# Patient Record
Sex: Male | Born: 1970 | Race: White | Hispanic: No
Health system: Western US, Community
[De-identification: ages and names within clinical notes are randomized; demographics above are authoritative.]

## PROBLEM LIST (undated history)

## (undated) DIAGNOSIS — F4310 Post-traumatic stress disorder, unspecified: Secondary | ICD-10-CM

## (undated) DIAGNOSIS — F419 Anxiety disorder, unspecified: Secondary | ICD-10-CM

## (undated) DIAGNOSIS — F329 Major depressive disorder, single episode, unspecified: Secondary | ICD-10-CM

## (undated) DIAGNOSIS — F319 Bipolar disorder, unspecified: Secondary | ICD-10-CM

## (undated) DIAGNOSIS — F32A Depression, unspecified: Secondary | ICD-10-CM

---

## 2001-09-30 ENCOUNTER — Encounter: Payer: Self-pay | Admitting: Family Medicine

## 2001-09-30 ENCOUNTER — Encounter: Admission: RE | Admit: 2001-09-30 | Discharge: 2001-09-30 | Payer: Self-pay | Admitting: Family Medicine

## 2003-11-27 ENCOUNTER — Inpatient Hospital Stay (HOSPITAL_COMMUNITY): Admission: EM | Admit: 2003-11-27 | Discharge: 2003-11-28 | Payer: Self-pay | Admitting: *Deleted

## 2003-12-13 ENCOUNTER — Inpatient Hospital Stay (HOSPITAL_COMMUNITY): Admission: EM | Admit: 2003-12-13 | Discharge: 2003-12-13 | Payer: Self-pay | Admitting: Emergency Medicine

## 2005-05-11 ENCOUNTER — Ambulatory Visit: Payer: Self-pay | Admitting: Family Medicine

## 2006-02-22 IMAGING — CR DG CHEST 2V
2 series · 2 of 2 positions shown · non-contrast
Comparison: none

CLINICAL DATA: 33-year-old with altered level of consciousness.  
 CHEST (TWO VIEWS)
 No prior exams are available for comparison.  Cardiomediastinal silhouette is within normal limits.  Lungs are free of focal consolidations and pleural effusions.  Left costophrenic angle is excluded on the frontal view. 
 IMPRESSION
 No evidence for acute cardiopulmonary disease.

[view not recorded (1 of 2)]
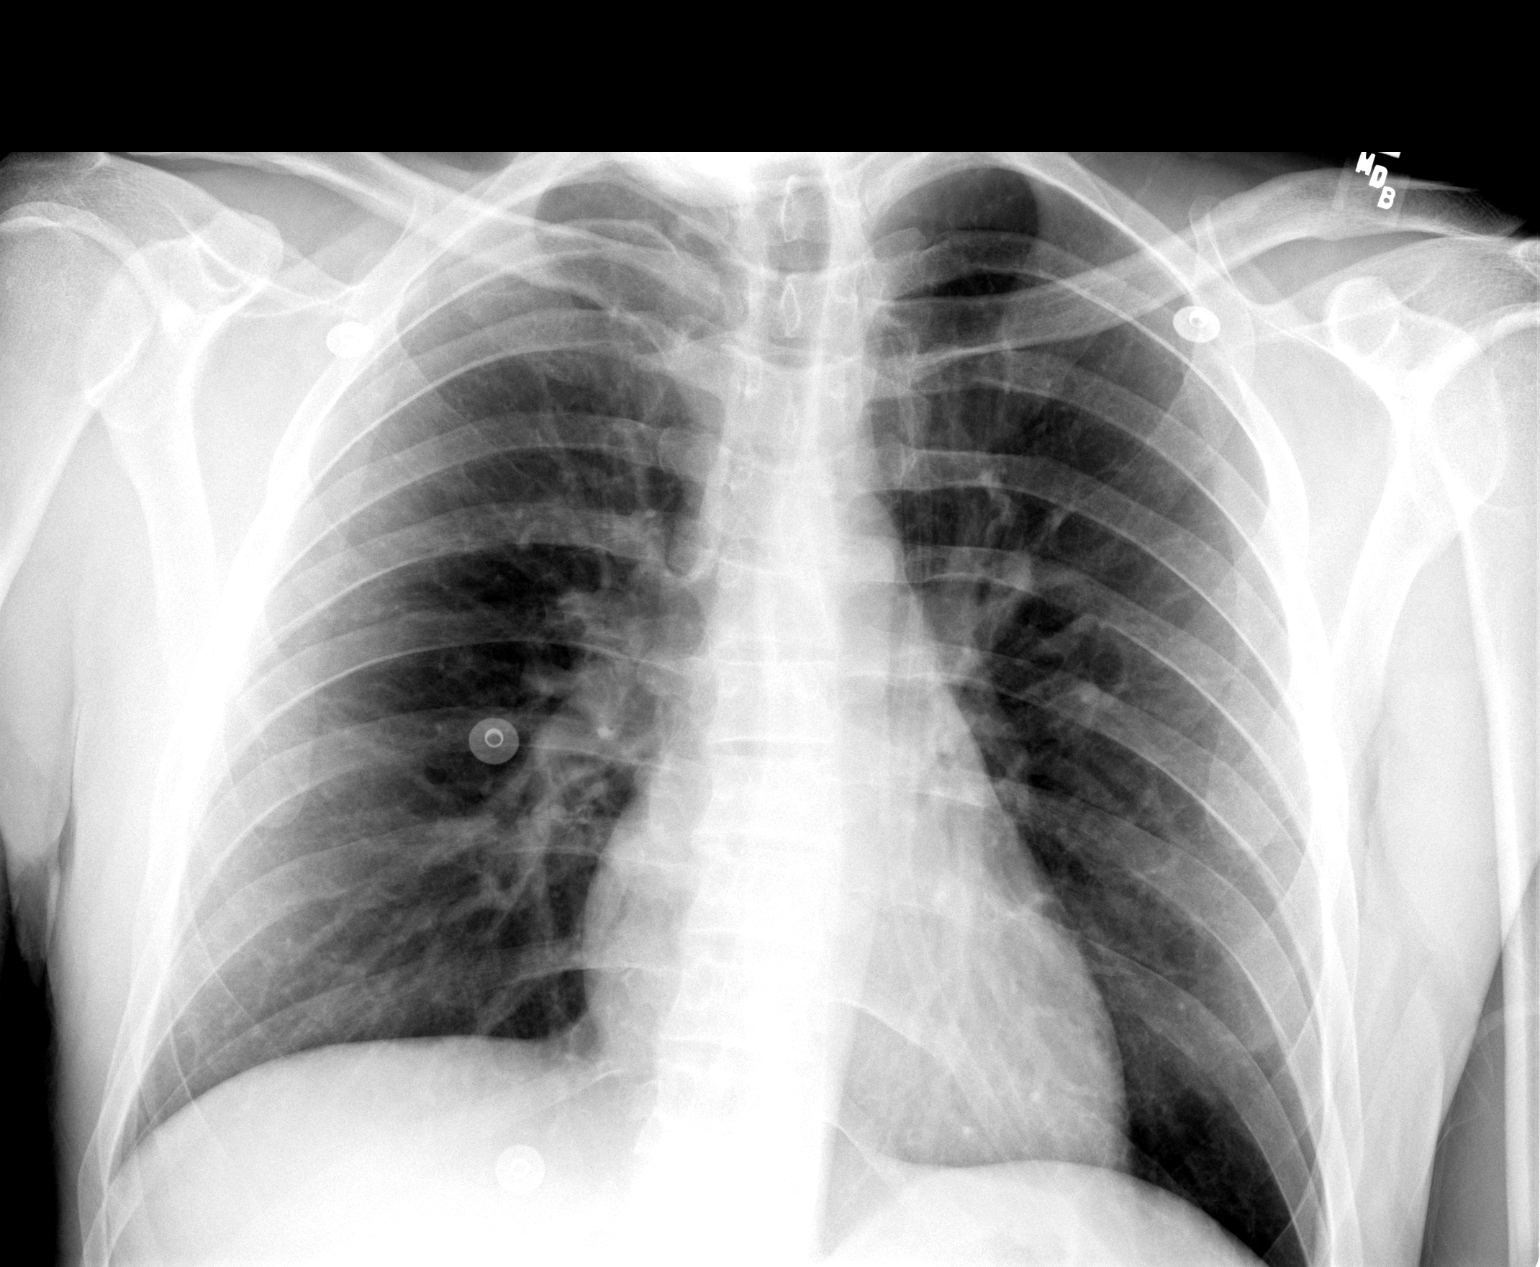

[view not recorded (2 of 2)]
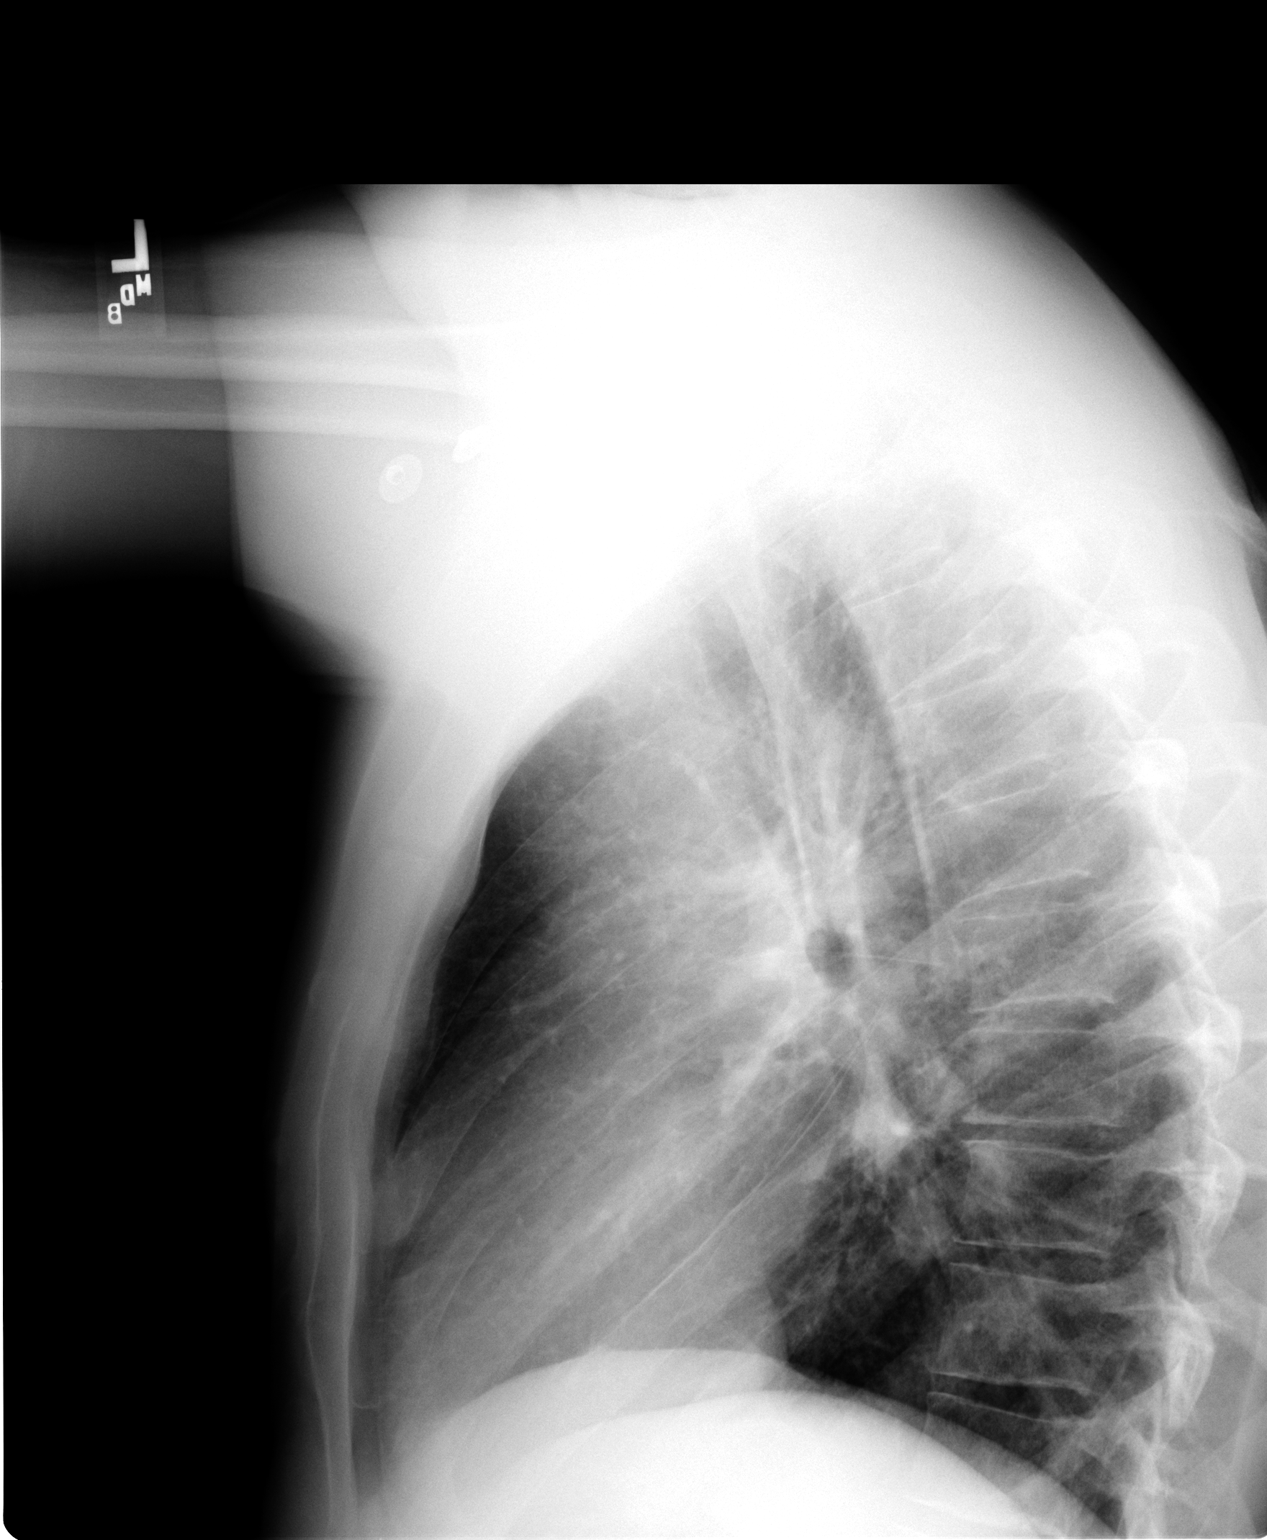

[2 of 2 positions shown; findings below may reference images not displayed]

## 2013-12-23 ENCOUNTER — Encounter (HOSPITAL_COMMUNITY): Payer: Self-pay | Admitting: Emergency Medicine

## 2013-12-23 ENCOUNTER — Emergency Department (HOSPITAL_COMMUNITY)
Admission: EM | Admit: 2013-12-23 | Discharge: 2013-12-23 | Discharge status: Against Medical Advice | Payer: Managed Care, Other (non HMO) | Attending: Emergency Medicine | Admitting: Emergency Medicine

## 2013-12-23 DIAGNOSIS — F329 Major depressive disorder, single episode, unspecified: Secondary | ICD-10-CM | POA: Diagnosis present

## 2013-12-23 DIAGNOSIS — F3289 Other specified depressive episodes: Secondary | ICD-10-CM | POA: Insufficient documentation

## 2013-12-23 DIAGNOSIS — F32A Depression, unspecified: Secondary | ICD-10-CM

## 2013-12-23 DIAGNOSIS — IMO0002 Reserved for concepts with insufficient information to code with codable children: Secondary | ICD-10-CM | POA: Diagnosis not present

## 2013-12-23 DIAGNOSIS — F17200 Nicotine dependence, unspecified, uncomplicated: Secondary | ICD-10-CM | POA: Insufficient documentation

## 2013-12-23 HISTORY — DX: Major depressive disorder, single episode, unspecified: F32.9

## 2013-12-23 HISTORY — DX: Bipolar disorder, unspecified: F31.9

## 2013-12-23 HISTORY — DX: Anxiety disorder, unspecified: F41.9

## 2013-12-23 HISTORY — DX: Post-traumatic stress disorder, unspecified: F43.10

## 2013-12-23 HISTORY — DX: Depression, unspecified: F32.A

## 2013-12-23 LAB — COMPREHENSIVE METABOLIC PANEL
ALT: 14 U/L (ref 0–53)
AST: 19 U/L (ref 0–37)
Albumin: 4.4 g/dL (ref 3.5–5.2)
Alkaline Phosphatase: 60 U/L (ref 39–117)
Anion gap: 12 (ref 5–15)
BUN: 19 mg/dL (ref 6–23)
CO2: 26 mEq/L (ref 19–32)
Calcium: 9.6 mg/dL (ref 8.4–10.5)
Chloride: 101 mEq/L (ref 96–112)
Creatinine, Ser: 1.09 mg/dL (ref 0.50–1.35)
GFR calc Af Amer: >90 mL/min (ref >90)
GFR calc non Af Amer: 82 mL/min — ABNORMAL LOW (ref >90)
Glucose, Bld: 96 mg/dL (ref 70–99)
Potassium: 4.4 mEq/L (ref 3.7–5.3)
Sodium: 139 mEq/L (ref 137–147)
Total Bilirubin: 0.2 mg/dL — ABNORMAL LOW (ref 0.3–1.2)
Total Protein: 7.7 g/dL (ref 6.0–8.3)

## 2013-12-23 LAB — CBC
HCT: 42.5 % (ref 39.0–52.0)
Hemoglobin: 15 g/dL (ref 13.0–17.0)
MCH: 34.3 pg — ABNORMAL HIGH (ref 26.0–34.0)
MCHC: 35.3 g/dL (ref 30.0–36.0)
MCV: 97.3 fL (ref 78.0–100.0)
Platelets: 289 10*3/uL (ref 150–400)
RBC: 4.37 MIL/uL (ref 4.22–5.81)
RDW: 12.7 % (ref 11.5–15.5)
WBC: 6.7 10*3/uL (ref 4.0–10.5)

## 2013-12-23 LAB — ETHANOL: Alcohol, Ethyl (B): <11 mg/dL (ref 0–11)

## 2013-12-23 NOTE — ED Notes (Signed)
Pt states that he has PTSD and anxiety.  Pt states that he has been having panic attacks.  Pt states that he has clean for about a month but yesterday went out and got drunk and did some coke. 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  Passively suicidal.  Denies HI.

## 2013-12-23 NOTE — ED Notes (Signed)
Came out of another pts room, pt not sitting on bed and not in restroom, was told by another staff member pt and wife left.

## 2013-12-23 NOTE — ED Provider Notes (Signed)
CSN: [id number]     Arrival date & time 12/23/13  1827 History   First MD Initiated Contact with Patient 12/23/13 2004     Chief Complaint  Patient presents with  . Anxiety   . Depression      (Consider location/radiation/quality/duration/timing/severity/associated sxs/prior Treatment) HPI Comments: Patient here complaining and depression without SI or HI. States he has had insomnia and anorexia. States that he did return last evening and use cocaine. Symptoms have been present for several days and getting worse. No visual or auditory hallucinations. Denies any command hallucinations. Symptoms persisted and no treatment used prior to arrival. Patient was on psychiatric meds for a long time but stopped them a couple weeks ago when he stopped seeing a psychiatrist. Nothing makes them better.  The history is provided by the patient and the spouse.    Past Medical History  Diagnosis Date  . Anxiety   . Depression   . PTSD (post-traumatic stress disorder)   . Bipolar 1 disorder    History reviewed. No pertinent past surgical history. History reviewed. No pertinent family history. History  Substance Use Topics  . Smoking status: Current Every Day Smoker  . Smokeless tobacco: Not on file  . Alcohol Use: Yes     Comment: binges    Review of Systems  All other systems reviewed and are negative.     Allergies  Codeine  Home Medications   Prior to Admission medications   Not on File   BP 109/80  Pulse 83  Temp(Src) 97.8 F (36.6 C) (Oral)  Resp 18  SpO2 100% Physical Exam  Nursing note and vitals reviewed. Constitutional: He is oriented to person, place, and time. He appears well-developed and well-nourished.  Non-toxic appearance. No distress.  HENT:  Head: Normocephalic and atraumatic.  Eyes: Conjunctivae, EOM and lids are normal. Pupils are equal, round, and reactive to light.  Neck: Normal range of motion. Neck supple. No tracheal deviation present. No mass  present.  Cardiovascular: Normal rate, regular rhythm and normal heart sounds.  Exam reveals no gallop.   No murmur heard. Pulmonary/Chest: Effort normal and breath sounds normal. No stridor. No respiratory distress. He has no decreased breath sounds. He has no wheezes. He has no rhonchi. He has no rales.  Abdominal: Soft. Normal appearance and bowel sounds are normal. He exhibits no distension. There is no tenderness. There is no rebound and no CVA tenderness.  Musculoskeletal: Normal range of motion. He exhibits no edema and no tenderness.  Neurological: He is alert and oriented to person, place, and time. He has normal strength. No cranial nerve deficit or sensory deficit. GCS eye subscore is 4. GCS verbal subscore is 5. GCS motor subscore is 6.  Skin: Skin is warm and dry. No abrasion and no rash noted.  Psychiatric: His speech is normal. His affect is labile. He is agitated. He expresses no suicidal plans and no homicidal plans.    ED Course  Procedures (including critical care time) Labs Review Labs Reviewed  CBC - Abnormal; Notable for the following:    MCH 34.3 (*)    All other components within normal limits  COMPREHENSIVE METABOLIC PANEL - Abnormal; Notable for the following:    Total Bilirubin 0.2 (*)    GFR calc non Af Amer 82 (*)    All other components within normal limits  ETHANOL  URINE RAPID DRUG SCREEN (HOSP PERFORMED)    Imaging Review No results found.   EKG Interpretation None  MDM   Final diagnoses:  None    Plan was to referred patient to behavior health for further assessment. Patient became upset and angry when I informed him that he was not able to leave and smoke cigarettes. I had a further discussion with the patient's wife and encouraged her to have him return for treatment.    Toy BakerAnthony T Amberlee Garvey, MD 12/23/13 2035

## 2013-12-23 NOTE — ED Notes (Signed)
Pt upset that he was placed in the hallway, states I'm tired of waiting, I'm going to smoke a cigarette, pt informed if he goes to smoke a cigarette he will be leaving and then he would have to restart this process, pt states "this is bullshit, I'm stuck in the hall and now I have to sit here and wait with no tv". Pt agitated.

## 2019-11-03 ENCOUNTER — Encounter (HOSPITAL_COMMUNITY): Payer: Self-pay

## 2019-11-03 ENCOUNTER — Observation Stay (HOSPITAL_COMMUNITY)
Admission: EM | Admit: 2019-11-03 | Discharge: 2019-11-03 | Disposition: A | Discharge status: Home / Self Care | Payer: Managed Care, Other (non HMO) | Attending: Internal Medicine | Admitting: Internal Medicine

## 2019-11-03 ENCOUNTER — Other Ambulatory Visit: Payer: Self-pay

## 2019-11-03 DIAGNOSIS — R5383 Other fatigue: Secondary | ICD-10-CM | POA: Diagnosis present

## 2019-11-03 DIAGNOSIS — F17200 Nicotine dependence, unspecified, uncomplicated: Secondary | ICD-10-CM | POA: Insufficient documentation

## 2019-11-03 DIAGNOSIS — Z20822 Contact with and (suspected) exposure to covid-19: Secondary | ICD-10-CM | POA: Insufficient documentation

## 2019-11-03 DIAGNOSIS — E8729 Other acidosis: Secondary | ICD-10-CM | POA: Diagnosis present

## 2019-11-03 DIAGNOSIS — F1490 Cocaine use, unspecified, uncomplicated: Secondary | ICD-10-CM | POA: Diagnosis present

## 2019-11-03 DIAGNOSIS — N179 Acute kidney failure, unspecified: Secondary | ICD-10-CM | POA: Diagnosis not present

## 2019-11-03 DIAGNOSIS — R739 Hyperglycemia, unspecified: Secondary | ICD-10-CM | POA: Diagnosis present

## 2019-11-03 DIAGNOSIS — R451 Restlessness and agitation: Secondary | ICD-10-CM | POA: Insufficient documentation

## 2019-11-03 DIAGNOSIS — E872 Acidosis: Secondary | ICD-10-CM

## 2019-11-03 LAB — CBC WITH DIFFERENTIAL/PLATELET
Abs Immature Granulocytes: 0.18 10*3/uL — ABNORMAL HIGH (ref 0.00–0.07)
Basophils Absolute: 0.1 10*3/uL (ref 0.0–0.1)
Basophils Relative: 1 %
Eosinophils Absolute: 0.1 10*3/uL (ref 0.0–0.5)
Eosinophils Relative: 1 %
HCT: 55.2 % — ABNORMAL HIGH (ref 39.0–52.0)
Hemoglobin: 17.6 g/dL — ABNORMAL HIGH (ref 13.0–17.0)
Immature Granulocytes: 2 %
Lymphocytes Relative: 33 %
Lymphs Abs: 4 10*3/uL (ref 0.7–4.0)
MCH: 33 pg (ref 26.0–34.0)
MCHC: 31.9 g/dL (ref 30.0–36.0)
MCV: 103.4 fL — ABNORMAL HIGH (ref 80.0–100.0)
Monocytes Absolute: 1 10*3/uL (ref 0.1–1.0)
Monocytes Relative: 8 %
Neutro Abs: 6.7 10*3/uL (ref 1.7–7.7)
Neutrophils Relative %: 55 %
Platelets: 323 10*3/uL (ref 150–400)
RBC: 5.34 MIL/uL (ref 4.22–5.81)
RDW: 13.9 % (ref 11.5–15.5)
WBC: 12 10*3/uL — ABNORMAL HIGH (ref 4.0–10.5)
nRBC: 0 % (ref 0.0–0.2)

## 2019-11-03 LAB — COMPREHENSIVE METABOLIC PANEL
ALT: 42 U/L (ref 0–44)
AST: 38 U/L (ref 15–41)
Albumin: 4.6 g/dL (ref 3.5–5.0)
Alkaline Phosphatase: 41 U/L (ref 38–126)
Anion gap: 32 — ABNORMAL HIGH (ref 5–15)
BUN: 30 mg/dL — ABNORMAL HIGH (ref 6–20)
CO2: 9 mmol/L — ABNORMAL LOW (ref 22–32)
Calcium: 10 mg/dL (ref 8.9–10.3)
Chloride: 100 mmol/L (ref 98–111)
Creatinine, Ser: 2.06 mg/dL — ABNORMAL HIGH (ref 0.61–1.24)
GFR calc Af Amer: 43 mL/min — ABNORMAL LOW (ref >60)
GFR calc non Af Amer: 37 mL/min — ABNORMAL LOW (ref >60)
Glucose, Bld: 260 mg/dL — ABNORMAL HIGH (ref 70–99)
Potassium: 5.3 mmol/L — ABNORMAL HIGH (ref 3.5–5.1)
Sodium: 141 mmol/L (ref 135–145)
Total Bilirubin: 0.7 mg/dL (ref 0.3–1.2)
Total Protein: 7.6 g/dL (ref 6.5–8.1)

## 2019-11-03 LAB — URINALYSIS, ROUTINE W REFLEX MICROSCOPIC
Bilirubin Urine: NEGATIVE
Glucose, UA: 50 mg/dL — AB
Hgb urine dipstick: NEGATIVE
Ketones, ur: 5 mg/dL — AB
Leukocytes,Ua: NEGATIVE
Nitrite: NEGATIVE
Protein, ur: >=300 mg/dL — AB
Specific Gravity, Urine: 1.022 (ref 1.005–1.030)
pH: 6 (ref 5.0–8.0)

## 2019-11-03 LAB — RAPID URINE DRUG SCREEN, HOSP PERFORMED
Amphetamines: NOT DETECTED
Barbiturates: NOT DETECTED
Benzodiazepines: POSITIVE — AB
Cocaine: POSITIVE — AB
Opiates: NOT DETECTED
Tetrahydrocannabinol: NOT DETECTED

## 2019-11-03 LAB — CK: Total CK: 528 U/L — ABNORMAL HIGH (ref 49–397)

## 2019-11-03 LAB — CBG MONITORING, ED: Glucose-Capillary: 273 mg/dL — ABNORMAL HIGH (ref 70–99)

## 2019-11-03 LAB — ETHANOL: Alcohol, Ethyl (B): <10 mg/dL (ref <10)

## 2019-11-03 LAB — SARS CORONAVIRUS 2 BY RT PCR (HOSPITAL ORDER, PERFORMED IN ~~LOC~~ HOSPITAL LAB): SARS Coronavirus 2: NEGATIVE

## 2019-11-03 MED ORDER — HEPARIN SODIUM (PORCINE) 5000 UNIT/ML IJ SOLN: 5000.0000 [IU] | Freq: Three times a day (TID) | INTRAMUSCULAR | Status: DC

## 2019-11-03 MED ORDER — SODIUM CHLORIDE 0.9 % IV SOLN: INTRAVENOUS | Status: DC

## 2019-11-03 MED ORDER — ACETAMINOPHEN 650 MG RE SUPP: 650.0000 mg | Freq: Four times a day (QID) | RECTAL | Status: DC | PRN

## 2019-11-03 MED ORDER — NICOTINE 14 MG/24HR TD PT24: 14.0000 mg | MEDICATED_PATCH | Freq: Every day | TRANSDERMAL | Status: DC

## 2019-11-03 MED ORDER — ACETAMINOPHEN 325 MG PO TABS: 650.0000 mg | ORAL_TABLET | Freq: Four times a day (QID) | ORAL | Status: DC | PRN

## 2019-11-03 MED ORDER — ONDANSETRON HCL 4 MG/2ML IJ SOLN: 4.0000 mg | Freq: Four times a day (QID) | INTRAMUSCULAR | Status: DC | PRN

## 2019-11-03 MED ORDER — ONDANSETRON HCL 4 MG PO TABS: 4.0000 mg | ORAL_TABLET | Freq: Four times a day (QID) | ORAL | Status: DC | PRN

## 2019-11-03 MED ORDER — SODIUM CHLORIDE 0.9 % IV BOLUS
2000.0000 mL | Freq: Once | INTRAVENOUS | Status: AC
  Administered 2019-11-03: 2000 mL via INTRAVENOUS

## 2019-11-03 NOTE — H&P (Signed)
[facility] History and Physical  Joe Porter DOB: April 15, 1970 DOA: 11/03/2019   PCP: Patient, No Pcp Per  Specialists: None  Chief Complaint: Fatigue, agitation  HPI: Joe Porter is a 49 y.o. male with past medical history of bipolar disorder according to the EDP notes who is here from [place].  Apparently he is in the process of separating from his wife and has been under a lot of stress.  He smoked some cocaine earlier this morning which was followed by fatigue.  Apparently had some tactile hallucinations.  And then he got agitated.  Unclear as to who called EMS.  He was noted to be aggressive.  Had to be placed in restraints and had to be given Versed.  Noted to be tachycardic and hypotensive.  Was brought into the emergency department.  He was also noted to have abrasions on his feet.  Patient mentions that he fell down due to profound weakness.  Feels better now.  Denies any chest pain shortness of breath nausea vomiting or diarrhea.  Denies any history of diabetes but states that he takes "supplements" which causes glucose level to be high.  He used to take steroids till about few weeks ago when he was working out.  States he is up-to-date on his tetanus vaccination.  Received a booster last year.  In the emergency department patient was found to have high anion gap metabolic acidosis and acute renal failure.  He was noted to be hyperglycemic.  Initially he was tachycardic.  Heart rate has improved with hydration.  Due to other electrolyte abnormalities and metabolic derangements he will need hospitalization.  Home Medications: Prior to Admission medications   Not on File    Allergies:  Allergies  Allergen Reactions  . Codeine     jaundice    Past Medical History: Past Medical History:  Diagnosis Date  . Anxiety   . Bipolar 1 disorder (HCC)   . Depression   . PTSD (post-traumatic stress disorder)     History reviewed. No pertinent surgical  history.  Social History: He usually lives in [place].  Smokes half a pack of cigarettes on a daily basis.  Denies any alcohol use.  Denies any IV drug use.  Does not use cocaine on a regular basis.  Used it just once this morning.  Has been drug-free for past 5 years according to him.  Family History:   Denies any health problems in his family.  Review of Systems - History obtained from the patient General ROS: positive for  - fatigue Psychological ROS: positive for - anxiety Ophthalmic ROS: negative ENT ROS: negative Allergy and Immunology ROS: negative Hematological and Lymphatic ROS: negative Endocrine ROS: negative Respiratory ROS: no cough, shortness of breath, or wheezing Cardiovascular ROS: no chest pain or dyspnea on exertion Gastrointestinal ROS: no abdominal pain, change in bowel habits, or black or bloody stools Genito-Urinary ROS: no dysuria, trouble voiding, or hematuria Musculoskeletal ROS: negative Neurological ROS: no TIA or stroke symptoms Dermatological ROS: negative  Physical Examination  Vitals:   11/03/19 1009 11/03/19 1010 11/03/19 1015 11/03/19 1034  BP: (!) 95/47  (!) 95/47 (!) 91/54  Pulse: 99  (!) 108 (!) 120  Resp: (!) 40  (!) 30 (!) 26  Temp:  99.3 F (37.4 C)    TempSrc: Axillary Axillary    SpO2: 95%  96% 98%  Weight:  78.9 kg    Height:  6\' 1"  (1.854 m)      BP )  91/54   Pulse (!) 120   Temp 99.3 F (37.4 C) (Axillary)   Resp (!) 26   Ht 6\' 1"  (1.854 m)   Wt 78.9 kg   SpO2 98%   BMI 22.96 kg/m   General appearance: alert, cooperative, appears stated age and no distress Head: Normocephalic, without obvious abnormality, atraumatic Eyes: conjunctivae/corneas clear. PERRL, EOM's intact.  Throat: lips, mucosa, and tongue normal; teeth and gums normal Neck: no adenopathy, no carotid bruit, no JVD, supple, symmetrical, trachea midline and thyroid not enlarged, symmetric, no tenderness/mass/nodules Resp: clear to auscultation  bilaterally Cardio: regular rate and rhythm, S1, S2 normal, no murmur, click, rub or gallop GI: soft, non-tender; bowel sounds normal; no masses,  no organomegaly Extremities: extremities normal, atraumatic, no cyanosis or edema Pulses: 2+ and symmetric Skin: Abrasions and skin tears noted in the feet bilaterally Lymph nodes: Cervical, supraclavicular, and axillary nodes normal. Neurologic: Alert and oriented x3.  Cranial nerves II to XII intact.  Motor strength equal bilateral upper and lower extremity.  Able to lift both legs off the bed.   Labs on Admission: I have personally reviewed following labs and imaging studies  CBC: Recent Labs  Lab 11/03/19 1018  WBC 12.0*  NEUTROABS 6.7  HGB 17.6*  HCT 55.2*  MCV 103.4*  PLT 323   Basic Metabolic Panel: Recent Labs  Lab 11/03/19 1018  NA 141  K 5.3*  CL 100  CO2 9*  GLUCOSE 260*  BUN 30*  CREATININE 2.06*  CALCIUM 10.0   GFR: Estimated Creatinine Clearance: 48.9 mL/min (A) (by C-G formula based on SCr of 2.06 mg/dL (H)). Liver Function Tests: Recent Labs  Lab 11/03/19 1018  AST 38  ALT 42  ALKPHOS 41  BILITOT 0.7  PROT 7.6  ALBUMIN 4.6   Cardiac Enzymes: Recent Labs  Lab 11/03/19 1018  CKTOTAL 528*   CBG: Recent Labs  Lab 11/03/19 1017  GLUCAP 273*      Problem List  Principal Problem:   ARF (acute renal failure) (HCC) Active Problems:   Cocaine use   High anion gap metabolic acidosis   Hyperglycemia   AKI (acute kidney injury) (HCC)   Assessment: This is a 49 year old Caucasian male who used cocaine this morning and developed agitation and aggressive behavior.  He was given Versed and appears to have calmed down.  His blood work reveals acute renal failure and hyperglycemia with elevated anion gap metabolic acidosis.  Plan:  1. Acute kidney injury with metabolic acidosis/mild hyperkalemia: Likely due to cocaine use.  Patient has been making urine.  We will check a UA.  Aggressively hydrate  him.  Recheck his labs.  Potassium level will be rechecked.  His CK level is slightly elevated possibly due to cocaine use.  Check again tomorrow.  Aggressively hydrate.  2.  High anion gap metabolic acidosis with hyperglycemia: Patient states that he takes a lot of "supplements" which causes glucose level to be elevated.  Used to take steroids as well.  Denies any history of diabetes but has had high glucose levels previously.  He is noted to have elevated anion gap.  Unclear if this is DKA or if his acidosis is related to renal insufficiency or combination of both.  We will check a beta hydroxybutyric acid level, lactic acid, check UA, repeat his labs.  Check HbA1c.  May need to use insulin labs are persistently abnormal.  3. Acute delirium secondary to cocaine: Seems to be back to baseline now.  No focal  deficits.  4.  Macrocytosis: Check TSH B12 folate levels.  5. Cocaine use: Patient mentions that he has been drug-free for 5 years. He has been under a lot of stress recently.  He smoked cocaine earlier today which was his first use in a very long time.  Counseled.  6. Leukocytosis: Likely reactive.  He is afebrile.  No evidence for infection.  Follow-up on UA.  7.  Tobacco abuse: Nicotine patch.   DVT Prophylaxis: Subcutaneous heparin Code Status: Full code Family Communication: Discussed with the patient Disposition: Hopefully return home in improved Consults called: None Admission Status: Status is: Observation  The patient remains OBS appropriate and will d/c before 2 midnights.  Dispo: The patient is from: Home              Anticipated d/c is to: Home              Anticipated d/c date is: 1 day              Patient currently is not medically stable to d/c.   Severity of Illness: The appropriate patient status for this patient is OBSERVATION. Observation status is judged to be reasonable and necessary in order to provide the required intensity of service to ensure the patient's  safety. The patient's presenting symptoms, physical exam findings, and initial radiographic and laboratory data in the context of their medical condition is felt to place them at decreased risk for further clinical deterioration. Furthermore, it is anticipated that the patient will be medically stable for discharge from the hospital within 2 midnights of admission. The following factors support the patient status of observation.   " The patient's presenting symptoms include agitation. " The physical exam findings include tachycardia. " The initial radiographic and laboratory data are significant for acute renal failure.   Further management decisions will depend on results of further testing and patient's response to treatment.   Shellyann Wandrey [employer]  [facility] [employer] on [email or web address]  11/03/2019, 1:12 PM

## 2019-11-03 NOTE — ED Notes (Signed)
Pt reported that he was not willing to stay and will follow up another day. MD notified Pt leaving AMA. Pt removed IV.

## 2019-11-03 NOTE — ED Triage Notes (Signed)
Pt BIBA in restraints placed by EMS. Pt states he did crack this morning, but it "feels different". Pt was banging head on floor. Pt has abrasions on back. Pt was aggressive with EMS. Pt was given 5 versed and restrained.  20 R AC 98/67 123 92% RA> 96% 2L CBG WNL

## 2019-11-03 NOTE — ED Notes (Signed)
Pt A&O x4 at this time. Reports he was having some issues with his wife who is out in [place]. 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

## 2019-11-03 NOTE — ED Provider Notes (Signed)
[name]-EMERGENCY DEPT Provider Note   CSN: [id number] Arrival date & time: 11/03/19  1004     History Chief Complaint  Patient presents with   Drug Overdose    Joe Porter is a 49 y.o. male.  49 year old male presents with increased agitation after ingesting what he thought was cocaine.  Developed tactile hallucinations.  Denies any alcohol use.  Patient banged his head on the floor but did not lose consciousness.  Started scratching at his body.  Also takes Lexapro for his bipolar disorder.  EMS called and patient given 5 mg of Versed IV and restrain for his protection.  Patient's blood sugar was within normal limits per EMS.        Past Medical History:  Diagnosis Date   Anxiety    Bipolar 1 disorder (HCC)    Depression    PTSD (post-traumatic stress disorder)     There are no problems to display for this patient.   History reviewed. No pertinent surgical history.     No family history on file.  Social History   Tobacco Use   Smoking status: Current Every Day Smoker  Substance Use Topics   Alcohol use: Yes    Comment: binges   Drug use: Yes    Types: Cocaine    Comment: binges    Home Medications Prior to Admission medications   Not on File    Allergies    Codeine  Review of Systems   Review of Systems  Unable to perform ROS: Psychiatric disorder    Physical Exam Updated Vital Signs BP (!) 95/47    Pulse 99    Temp 99.3 F (37.4 C) (Axillary)    Resp (!) 40    Ht 1.854 m (6\' 1" )    Wt 78.9 kg    SpO2 95%    BMI 22.96 kg/m   Physical Exam Vitals and nursing note reviewed.  Constitutional:      General: He is not in acute distress.    Appearance: Normal appearance. He is well-developed. He is not toxic-appearing.  HENT:     Head: Normocephalic and atraumatic.  Eyes:     General: Lids are normal.     Conjunctiva/sclera: Conjunctivae normal.     Pupils: Pupils are equal, round, and reactive to light.    Neck:     Thyroid: No thyroid mass.     Trachea: No tracheal deviation.  Cardiovascular:     Rate and Rhythm: Normal rate and regular rhythm.     Heart sounds: Normal heart sounds. No murmur heard.  No gallop.   Pulmonary:     Effort: Pulmonary effort is normal. No respiratory distress.     Breath sounds: Normal breath sounds. No stridor. No decreased breath sounds, wheezing, rhonchi or rales.  Abdominal:     General: Bowel sounds are normal. There is no distension.     Palpations: Abdomen is soft.     Tenderness: There is no abdominal tenderness. There is no rebound.  Musculoskeletal:        General: No tenderness. Normal range of motion.     Cervical back: Normal range of motion and neck supple.  Skin:    General: Skin is warm and dry.     Findings: No abrasion or rash.  Neurological:     General: No focal deficit present.     Mental Status: He is alert and oriented to person, place, and time.     GCS: GCS  eye subscore is 4. GCS verbal subscore is 5. GCS motor subscore is 6.     Cranial Nerves: No cranial nerve deficit.     Sensory: No sensory deficit.  Psychiatric:        Attention and Perception: He is inattentive.        Mood and Affect: Affect is labile.        Speech: Speech is slurred.        Behavior: Behavior is aggressive.     ED Results / Procedures / Treatments   Labs (all labs ordered are listed, but only abnormal results are displayed) Labs Reviewed - No data to display  EKG None  Radiology No results found.  Procedures Procedures (including critical care time)  Medications Ordered in ED Medications  sodium chloride 0.9 % bolus 2,000 mL (has no administration in time range)  0.9 %  sodium chloride infusion (has no administration in time range)    ED Course  I have reviewed the triage vital signs and the nursing notes.  Pertinent labs & imaging results that were available during my care of the patient were reviewed by me and considered in my  medical decision making (see chart for details).    MDM Rules/Calculators/A&P                          Patient given IV fluids here.  Has evidence of acute kidney injury.  Also tachycardic likely from this and mildly hypotensive.  Could be from other medications.  Will admit to the hospitalist service Final Clinical Impression(s) / ED Diagnoses Final diagnoses:  None    Rx / DC Orders ED Discharge Orders    None       Lorre Nick, MD 11/03/19 1254

## 2019-11-03 NOTE — Discharge Summary (Signed)
Please review H&P dictated at 1 PM earlier today.  Was informed by the nursing staff a few minutes ago the patient did not want to stay.  He was told that he could experience a worsening in his condition which could potentially lead to death.  He was told about his kidney failure.  He was told about the metabolic acidosis in layman terms.  He was told that he will need further work-up and possibly further treatments.  However patient was adamant about leaving.  He left AGAINST MEDICAL ADVICE.

## 2021-05-21 ENCOUNTER — Emergency Department (HOSPITAL_COMMUNITY)
Admission: EM | Admit: 2021-05-21 | Discharge: 2021-05-21 | Disposition: A | Discharge status: Home / Self Care | Payer: Self-pay | Attending: Emergency Medicine | Admitting: Emergency Medicine

## 2021-05-21 ENCOUNTER — Encounter (HOSPITAL_COMMUNITY): Payer: Self-pay | Admitting: Emergency Medicine

## 2021-05-21 ENCOUNTER — Emergency Department (HOSPITAL_COMMUNITY): Payer: Self-pay

## 2021-05-21 DIAGNOSIS — F439 Reaction to severe stress, unspecified: Secondary | ICD-10-CM | POA: Insufficient documentation

## 2021-05-21 DIAGNOSIS — Z720 Tobacco use: Secondary | ICD-10-CM | POA: Insufficient documentation

## 2021-05-21 DIAGNOSIS — R0789 Other chest pain: Secondary | ICD-10-CM

## 2021-05-21 LAB — CBC
HCT: 40.8 % (ref 39.0–52.0)
Hemoglobin: 14.1 g/dL (ref 13.0–17.0)
MCH: 33.7 pg (ref 26.0–34.0)
MCHC: 34.6 g/dL (ref 30.0–36.0)
MCV: 97.6 fL (ref 80.0–100.0)
Platelets: 257 10*3/uL (ref 150–400)
RBC: 4.18 MIL/uL — ABNORMAL LOW (ref 4.22–5.81)
RDW: 13 % (ref 11.5–15.5)
WBC: 6.9 10*3/uL (ref 4.0–10.5)
nRBC: 0 % (ref 0.0–0.2)

## 2021-05-21 LAB — COMPREHENSIVE METABOLIC PANEL
ALT: 25 U/L (ref 0–44)
AST: 31 U/L (ref 15–41)
Albumin: 4 g/dL (ref 3.5–5.0)
Alkaline Phosphatase: 45 U/L (ref 38–126)
Anion gap: 10 (ref 5–15)
BUN: 11 mg/dL (ref 6–20)
CO2: 21 mmol/L — ABNORMAL LOW (ref 22–32)
Calcium: 8.8 mg/dL — ABNORMAL LOW (ref 8.9–10.3)
Chloride: 106 mmol/L (ref 98–111)
Creatinine, Ser: 1.02 mg/dL (ref 0.61–1.24)
GFR, Estimated: >60 mL/min (ref >60)
Glucose, Bld: 107 mg/dL — ABNORMAL HIGH (ref 70–99)
Potassium: 3.8 mmol/L (ref 3.5–5.1)
Sodium: 137 mmol/L (ref 135–145)
Total Bilirubin: 0.7 mg/dL (ref 0.3–1.2)
Total Protein: 6.5 g/dL (ref 6.5–8.1)

## 2021-05-21 LAB — TROPONIN I (HIGH SENSITIVITY)
Troponin I (High Sensitivity): 5 ng/L (ref <18)
Troponin I (High Sensitivity): 6 ng/L (ref <18)
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
## 2021-05-21 NOTE — ED Notes (Signed)
Patient transported to X-ray 
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
## 2021-05-21 NOTE — ED Notes (Signed)
Patient discharge instructions reviewed with the patient. The patient verbalized understanding of instructions. Patient discharged. 

## 2021-12-27 ENCOUNTER — Institutional Professional Consult (permissible substitution): Admit: 2021-12-27 | Discharge: 2021-12-27

## 2021-12-27 DIAGNOSIS — Z0283 Encounter for blood-alcohol and blood-drug test: Secondary | ICD-10-CM

## 2021-12-27 NOTE — Progress Notes (Signed)
7 panel medtox.

## 2022-02-19 ENCOUNTER — Institutional Professional Consult (permissible substitution): Admit: 2022-02-19 | Discharge: 2022-02-19

## 2022-02-19 DIAGNOSIS — Z0110 Encounter for examination of ears and hearing without abnormal findings: Secondary | ICD-10-CM

## 2022-02-19 NOTE — Progress Notes (Signed)
AUDIO TEST

## 2022-02-19 NOTE — Progress Notes (Signed)
NON DOT DS

## 2022-02-20 ENCOUNTER — Ambulatory Visit: Admit: 2022-02-20 | Discharge: 2022-02-20 | Attending: Student in an Organized Health Care Education/Training Program

## 2022-02-20 DIAGNOSIS — Z0289 Encounter for other administrative examinations: Secondary | ICD-10-CM

## 2022-02-20 NOTE — Progress Notes (Signed)
OSHA Respirator questionnaire and studies reviewed   Cleared for respirator fitting

## 2022-02-26 ENCOUNTER — Institutional Professional Consult (permissible substitution): Admit: 2022-02-26 | Discharge: 2022-02-26

## 2022-02-26 DIAGNOSIS — Z0283 Encounter for blood-alcohol and blood-drug test: Secondary | ICD-10-CM

## 2022-02-26 NOTE — Progress Notes (Signed)
Non DOT Collection Only

## 2023-08-17 IMAGING — DX DG CHEST 2V
2 series · 2 of 2 positions shown · non-contrast
Comparison: PA Lat 11/27/2003

CLINICAL DATA: Chest pain.

EXAM:
CHEST - 2 VIEW

[chest pa]
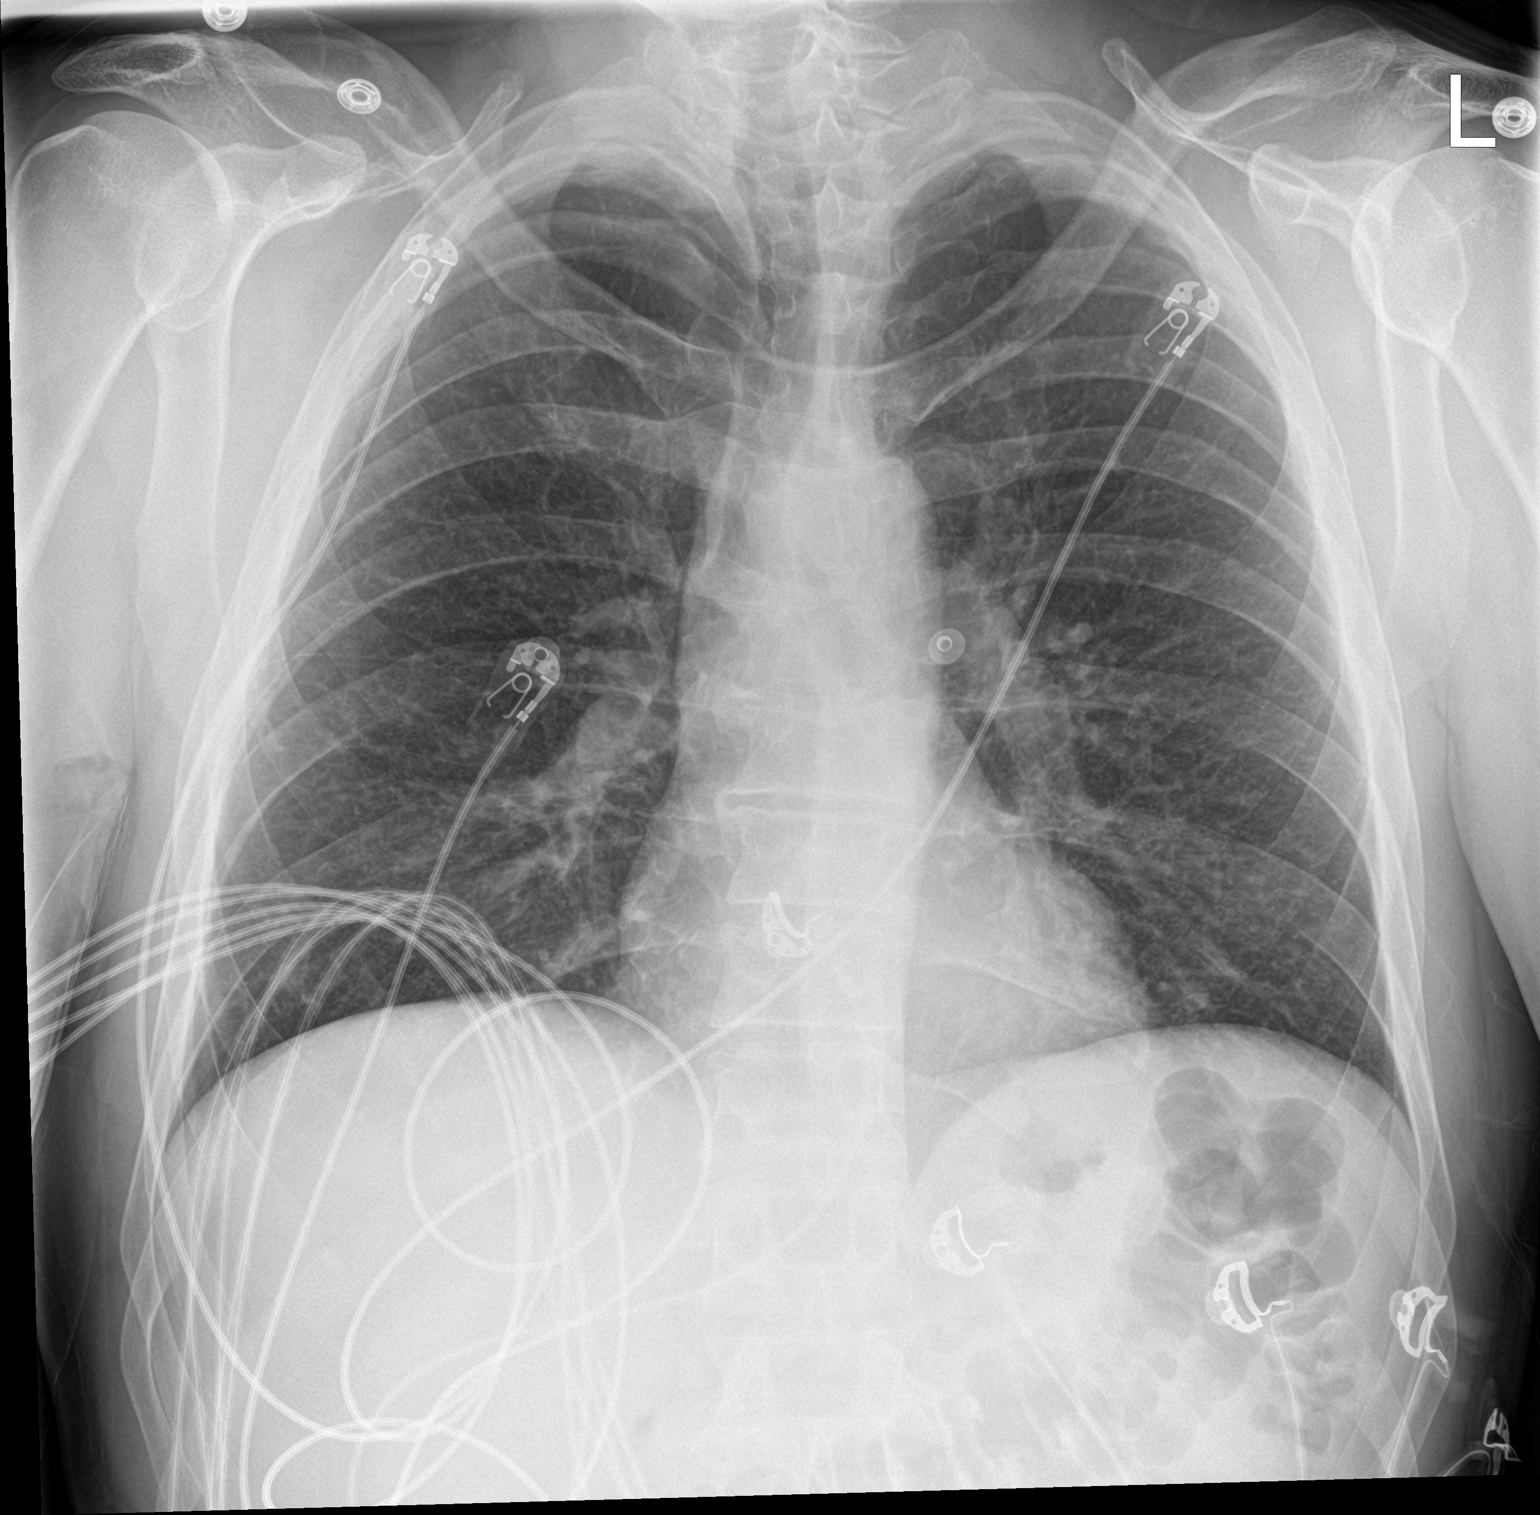

[chest lat]
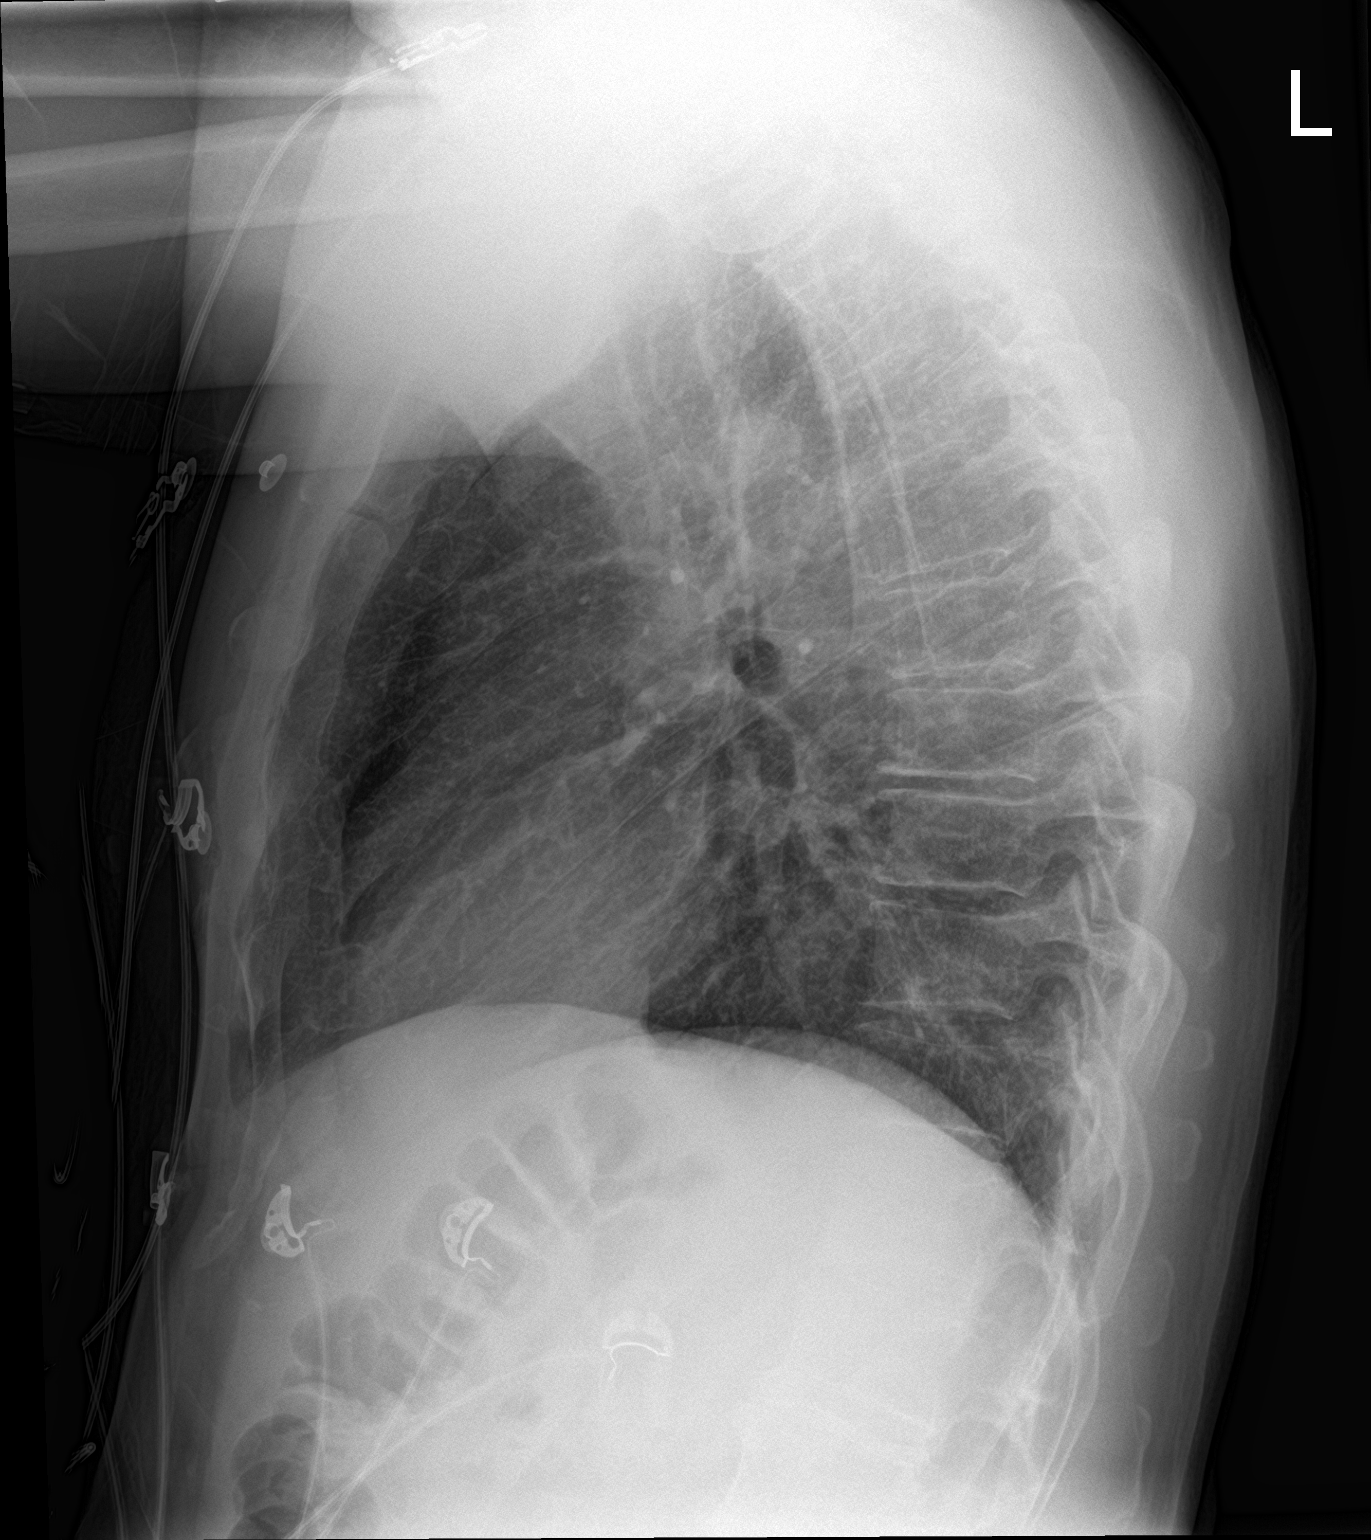

[2 of 2 positions shown; findings below may reference images not displayed]

FINDINGS: The heart size and mediastinal contours are within normal limits.
Both lungs are clear. The visualized skeletal structures again show
mild osteopenia, mild thoracic spondylosis.
IMPRESSION: No active cardiopulmonary disease.  Stable chest.
